# Patient Record
Sex: Female | Born: 1995
Health system: Southern US, Community
[De-identification: ages and names within clinical notes are randomized; demographics above are authoritative.]

---
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
## 2016-11-19 ENCOUNTER — Ambulatory Visit (INDEPENDENT_AMBULATORY_CARE_PROVIDER_SITE_OTHER)

## 2016-11-19 ENCOUNTER — Ambulatory Visit (INDEPENDENT_AMBULATORY_CARE_PROVIDER_SITE_OTHER): Admitting: Podiatry

## 2016-11-19 ENCOUNTER — Ambulatory Visit: Payer: Self-pay

## 2016-11-19 ENCOUNTER — Telehealth: Payer: Self-pay | Admitting: *Deleted

## 2016-11-19 ENCOUNTER — Encounter: Payer: Self-pay | Admitting: Podiatry

## 2016-11-19 DIAGNOSIS — R52 Pain, unspecified: Secondary | ICD-10-CM | POA: Diagnosis not present

## 2016-11-19 DIAGNOSIS — M779 Enthesopathy, unspecified: Secondary | ICD-10-CM

## 2016-11-19 DIAGNOSIS — D3610 Benign neoplasm of peripheral nerves and autonomic nervous system, unspecified: Secondary | ICD-10-CM

## 2016-11-19 MED ORDER — MELOXICAM 7.5 MG PO TABS: 7.5000 mg | ORAL_TABLET | Freq: Every day | ORAL | 0 refills | Status: DC

## 2016-11-19 NOTE — Telephone Encounter (Signed)
Pt's mtr came in to discuss options for pt's foot pain, I told Catherine Miller it would best to come in to be evaluated by Dr. Jacqualyn Posey.11/19/2016-DrJacqualyn Posey ordered MRI with and without contrast of right foot r/o neuroma.

## 2016-11-22 ENCOUNTER — Ambulatory Visit
Admission: RE | Admit: 2016-11-22 | Discharge: 2016-11-22 | Disposition: A | Discharge status: Home / Self Care | Source: Ambulatory Visit | Attending: Podiatry | Admitting: Podiatry

## 2016-11-22 MED ORDER — GADOBENATE DIMEGLUMINE 529 MG/ML IV SOLN
12.0000 mL | Freq: Once | INTRAVENOUS | Status: AC | PRN
  Administered 2016-11-22: 12 mL via INTRAVENOUS

## 2016-11-22 NOTE — Progress Notes (Signed)
Subjective:    Patient ID: Gertie Gowda, female   DOB: 21 y.o.   MRN: [id number]   HPI 21 year old female presents the office today for concerns of right foot pain. She states that she had a sudden injury to her right foot while at home and she put pressure down to her foot. She states that she has some pain that she went to go see another doctor who is diagnosed with a neuroma show steroid injection should help. She points along the second interspace on the right foot which is the majority symptoms but she states the injections the right foot on the third interspace where she pointed with the injection was done. She states this been on the bottom months and the pain has slowly gotten better but does continue. She's concerned of a possible neuroma. She is also been diagnosed with plantar fasciitis and she gets some pain in the heel but overall this is not where the majority of her symptoms are localized. She denies any redness or warmth she does get some swelling to the foot.   Review of Systems  All other systems reviewed and are negative.       Objective:  Physical Exam General: AAO x3, NAD; presents with parents.   Dermatological: Skin is warm, dry and supple bilateral. Nails x 10 are well manicured; remaining integument appears unremarkable at this time. There are no open sores, no preulcerative lesions, no rash or signs of infection present.  Vascular: Dorsalis Pedis artery and Posterior Tibial artery pedal pulses are 2/4 bilateral with immedate capillary fill time. PThere is no pain with calf compression, swelling, warmth, erythema.   Neruologic: Grossly intact via light touch bilateral. Vibratory intact via tuning fork bilateral. Protective threshold with Semmes Wienstein monofilament intact to all pedal sites bilateral.   Musculoskeletal: There is no area pinpoint bony tenderness or pain the vibratory sensation to the bilateral lower extremities. Joint tenderness appears to  be localized to the right second interspace. Is no area of pain to the metatarsals. I am unable to palpate a neuroma in the second or third interspaces. There is trace edema to the second interspace there is no erythema or increase in warmth. This time there is very minimal discomfort to the plantar aspect of the heel on the course or insertion the plantar fascia. No pain lateral compression of the calcaneus. No pain on the Achilles tendon. Thompson test is negative. Muscular strength 5/5 in all groups tested bilateral.  Gait: Unassisted, Nonantalgic.      Assessment:     21 year old female with right foot pain which I believe is from contusion from injury, possible tendinitis/partial tear; rule out neuroma    Plan:   -Treatment options discussed including all alternatives, risks, and complications -Etiology of symptoms were discussed -X-rays were obtained and reviewed with the patient. Unable to appreciate any evidence of acute fracture today. -Given her continued pain on the interspace as well as the swelling I recommended a surgical shoe and case of a partial tear. She is concerned about a neuroma. I ordered an MRI to out neuroma or tendon tear. She is going back to [place] next week and we'll try get this done before she leaves. I recommended continue ice to the area as well as elevation. Hold off on exercising for now. -Meloxicam as needed. -Will discuss after the MRI. Encouraged to call any questions or concerns meantime.  Celesta Gentile, DPM

## 2016-11-23 NOTE — Telephone Encounter (Signed)
-----   Message from Trula Slade, DPM sent at 11/22/2016  6:30 PM EDT ----- Normal MRI- I sent a mychart message but can you please make sure they got it. The message included instructions. Thanks.

## 2016-11-27 NOTE — Telephone Encounter (Addendum)
-----   Message from Trula Slade, DPM sent at 11/22/2016  6:30 PM EDT ----- Normal MRI- I sent a mychart message but can you please make sure they got it. The message included instructions. Thanks.11/27/2016-I spoke with pt's ftr, and he states pt has received the MyChart message, and I told him to have pt call with andy

## 2016-12-16 ENCOUNTER — Other Ambulatory Visit: Payer: Self-pay | Admitting: Podiatry

## 2016-12-17 NOTE — Telephone Encounter (Signed)
Pt needs an appt prior to refills. 

## 2017-01-28 ENCOUNTER — Encounter: Payer: Self-pay | Admitting: Podiatry

## 2017-01-28 ENCOUNTER — Ambulatory Visit (INDEPENDENT_AMBULATORY_CARE_PROVIDER_SITE_OTHER): Admitting: Podiatry

## 2017-01-28 DIAGNOSIS — M779 Enthesopathy, unspecified: Secondary | ICD-10-CM

## 2017-01-29 NOTE — Progress Notes (Signed)
Subjective: Ms. Pethtel presents the office today for follow-up evaluation of right foot pain. She states that she is doing "good". She states that she has some occasional discomfort into her toes overall she is having no pain today she's had no swelling or redness. She says that when she starts to get pain she goes into a sandal and her toes start to grip. She goes back to a regular shoe and the arch supports the pain is resolved. She denies any recent injury or trauma. She has been doing yoga. Denies any systemic complaints such as fevers, chills, nausea, vomiting. No acute changes since last appointment, and no other complaints at this time.   Objective: AAO x3, NAD; presents with mother  DP/PT pulses palpable bilaterally, CRT less than 3 seconds This time there is no area pinpoint bony tenderness or pain the vibratory sensation. I'm unable to elicit any area of tenderness identified to bilateral feet. There is no overlying edema, erythema, increase in warmth. The toes in rectus position. all neuromas identified.  No open lesions or pre-ulcerative lesions.  No pain with calf compression, swelling, warmth, erythema  Assessment: Resolved right foot pain   Plan: -All treatment options discussed with the patient including all alternatives, risks, complications.  -I can discuss the MRI results with the patient. This point she is having no pain. Discussed with her change in shoe gear as well as orthotics. She's using over-the-counter inserts which are helping. If symptoms continue may need to consider custom insert. -Discussed rehabilitation, strengthening exercises for the digits. -Follow-up as needed.  -Patient encouraged to call the office with any questions, concerns, change in symptoms.   Celesta Gentile, DPM

## 2017-02-21 ENCOUNTER — Other Ambulatory Visit: Payer: Self-pay | Admitting: Gastroenterology

## 2017-02-21 ENCOUNTER — Other Ambulatory Visit: Payer: Self-pay | Admitting: Podiatry

## 2017-03-10 ENCOUNTER — Encounter: Payer: Self-pay | Admitting: Podiatry

## 2017-04-10 ENCOUNTER — Ambulatory Visit (HOSPITAL_COMMUNITY): Admit: 2017-04-10 | Admitting: Gastroenterology

## 2017-04-10 ENCOUNTER — Encounter (HOSPITAL_COMMUNITY): Payer: Self-pay

## 2017-04-10 SURGERY — ESOPHAGOGASTRODUODENOSCOPY (EGD) WITH PROPOFOL
Anesthesia: Monitor Anesthesia Care

## 2017-11-29 ENCOUNTER — Telehealth: Payer: Self-pay | Admitting: *Deleted

## 2017-11-29 NOTE — Telephone Encounter (Signed)
Refill request for meloxicam. Pt has not been evaluated in office in over 6 months. Return fax denying.

## 2018-03-25 ENCOUNTER — Ambulatory Visit (HOSPITAL_COMMUNITY)
Admission: EM | Admit: 2018-03-25 | Discharge: 2018-03-25 | Disposition: A | Discharge status: Home / Self Care | Payer: BLUE CROSS/BLUE SHIELD | Attending: Family Medicine | Admitting: Family Medicine

## 2018-03-25 ENCOUNTER — Encounter (HOSPITAL_COMMUNITY): Payer: Self-pay | Admitting: Emergency Medicine

## 2018-03-25 ENCOUNTER — Other Ambulatory Visit: Payer: Self-pay

## 2018-03-25 DIAGNOSIS — R0789 Other chest pain: Secondary | ICD-10-CM

## 2018-03-25 NOTE — ED Provider Notes (Signed)
[place] [facility]   [id number] 03/25/18 Arrival Time: 6295  ASSESSMENT & PLAN:  1. Chest discomfort   Suspect GERD-related. Trial of Zantac 150mg  BID.  Patient history and exam consistent with non-cardiac cause of chest pain. Worsening signs and symptoms discussed and patient verbalized understanding.  Chest pain precautions given. Follow-up Information    Kelton Pillar, MD.   Specialty:  Family Medicine Why:  As needed. Contact information: [address] [place] [address] [phone]        [facility].   Specialty:  Urgent Care Why:  If symptoms worsen. Contact information: [place] [phone]          Reviewed expectations re: course of current medical issues. Questions answered. Outlined signs and symptoms indicating need for more acute intervention. Patient verbalized understanding. After Visit Summary given.   SUBJECTIVE:  Catherine Miller is a 22 y.o. female who presents with complaint of:  Chest Discomfort: Onset gradual, 1 day ago, with stable course since that time. Describes discomfort as intermittent and burning in nature. Discomfort does not radiate. Rates discomfort as a 2/10 in intensity. Associated symptoms: denies dyspnea, fatigue, irregular heart beat, lower extremity edema, palpitations and syncope. Aggravating factors: eating; "maybe". Alleviating factors: none reported. No abdominal pain but occasional bloated feeling. Normal bowel/bladder habits. Is belching more than usual.  Social History   Tobacco Use  Smoking Status Unknown If Ever Smoked  Smokeless Tobacco Never Used    Cardiac risk factors: none. No OCP use.  OTC treatment: Zantac prn; questions mild help but sporadic use.  ROS: As per HPI. All other systems negative.   OBJECTIVE:  Vitals:   03/25/18 1231  BP: 105/66  Pulse: 75  Temp: 98.6 F (37 C)   TempSrc: Oral  SpO2: 100%    General appearance: alert; no distress Eyes: PERRLA; EOMI; conjunctiva normal HENT: normocephalic; atraumatic Neck: supple Lungs: clear to auscultation bilaterally Heart: regular rate and rhythm Chest Wall: non-tender Abdomen: soft, non-tender; bowel sounds normal; no masses or organomegaly; no guarding or rebound tenderness Extremities: no cyanosis or edema; symmetrical with no gross deformities Skin: warm and dry Psychological: alert and cooperative; normal mood and affect   No Known Allergies  PMH: Occasional acid reflux.  Social History   Socioeconomic History  . Marital status: Single    Spouse name: Not on file  . Number of children: Not on file  . Years of education: Not on file  . Highest education level: Not on file  Occupational History  . Not on file  Social Needs  . Financial resource strain: Not on file  . Food insecurity:    Worry: Not on file    Inability: Not on file  . Transportation needs:    Medical: Not on file    Non-medical: Not on file  Tobacco Use  . Smoking status: Unknown If Ever Smoked  . Smokeless tobacco: Never Used  Substance and Sexual Activity  . Alcohol use: Not on file  . Drug use: Not on file  . Sexual activity: Not on file  Lifestyle  . Physical activity:    Days per week: Not on file    Minutes per session: Not on file  . Stress: Not on file  Relationships  . Social connections:    Talks on phone: Not on file    Gets together: Not on file    Attends religious service: Not on  file    Active member of club or organization: Not on file    Attends meetings of clubs or organizations: Not on file    Relationship status: Not on file  . Intimate partner violence:    Fear of current or ex partner: Not on file    Emotionally abused: Not on file    Physically abused: Not on file    Forced sexual activity: Not on file  Other Topics Concern  . Not on file  Social History Narrative  . Not on file     FH: No h/o heart disease reported.  History reviewed. No pertinent surgical history.   Vanessa Kick, MD 04/02/18 [phone]

## 2018-03-25 NOTE — Discharge Instructions (Signed)
You may try over the counter Zantac 150mg  twice daily. This should help if indeed your symptoms are related to acid reflux.

## 2018-03-25 NOTE — ED Triage Notes (Signed)
Pt reports epigastric sharp pain that started yesterday while cooking.  She states she took a reflux medication at 0730 and Tums but it has not helped.  She describes it as intermittent and it feels the most pronounced when she stands up.

## 2018-08-14 IMAGING — MR MR FOOT*R* WO/W CM
4 of 8 series · 21 of 40 positions shown · IV contrast (multihance)
Comparison: Plain films right foot 11/19/2016.

CLINICAL DATA: Pain and cramping in the distal aspect of the right
foot and toes with exercise. Question neuroma.

EXAM:
MRI OF THE RIGHT FOREFOOT WITHOUT AND WITH CONTRAST
TECHNIQUE: Multiplanar, multisequence MR imaging of the right was performed
before and after the administration of intravenous contrast.
CONTRAST:  12 ml MULTIHANCE GADOBENATE DIMEGLUMINE 529 MG/ML IV SOLN

[Series 5: T2 fat-sat · sagittal · 3.0mm · 0.27mm/px · 5 of 24 slices shown (1 of 3)]
[im 1/24]
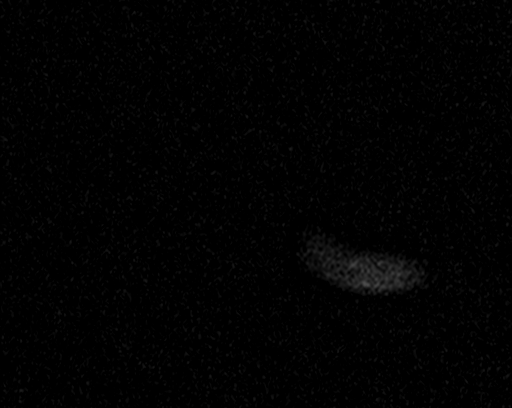
[im 6/24]
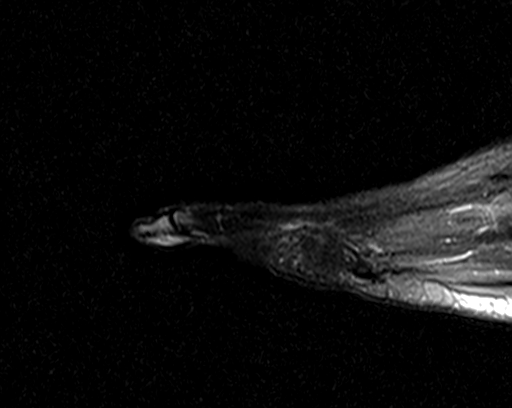
[im 12/24]
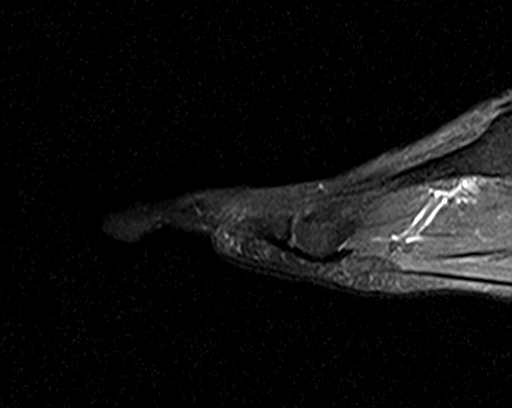
[im 18/24]
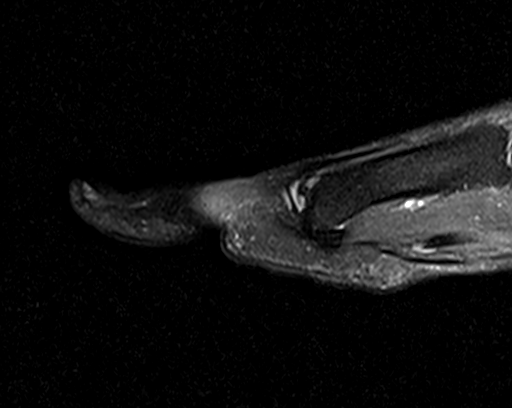
[im 24/24]
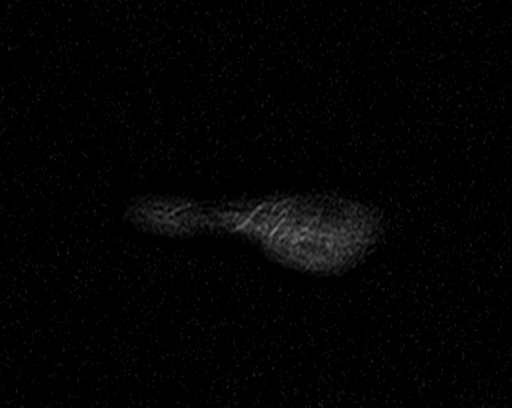

[Series 6: T1 · coronal · 3.0mm · 0.20mm/px · 6 of 36 slices shown]
[im 1/36]
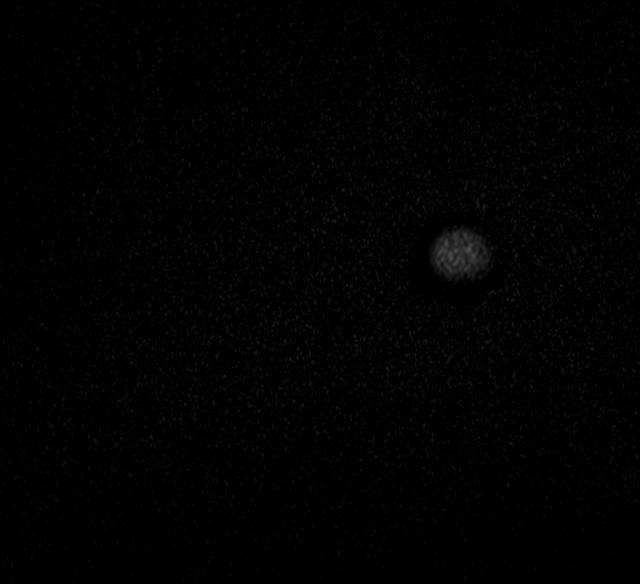
[im 6/36]
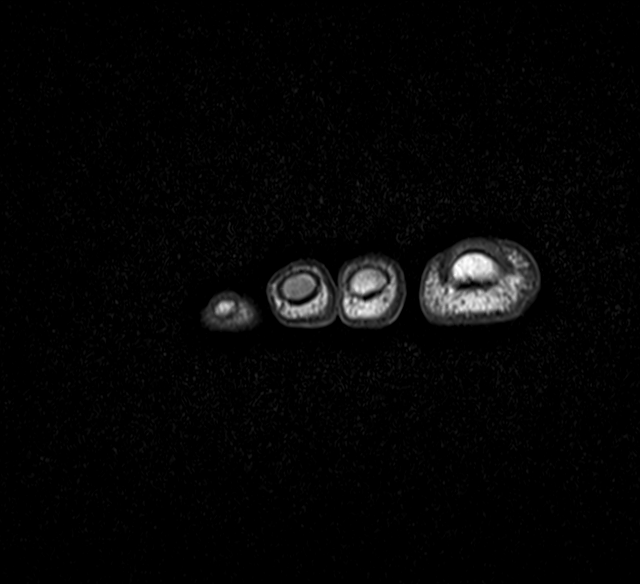
[im 12/36]
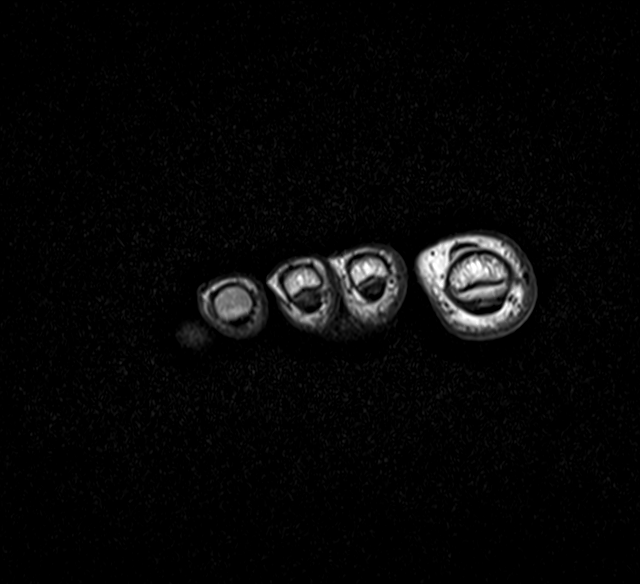
[im 18/36]
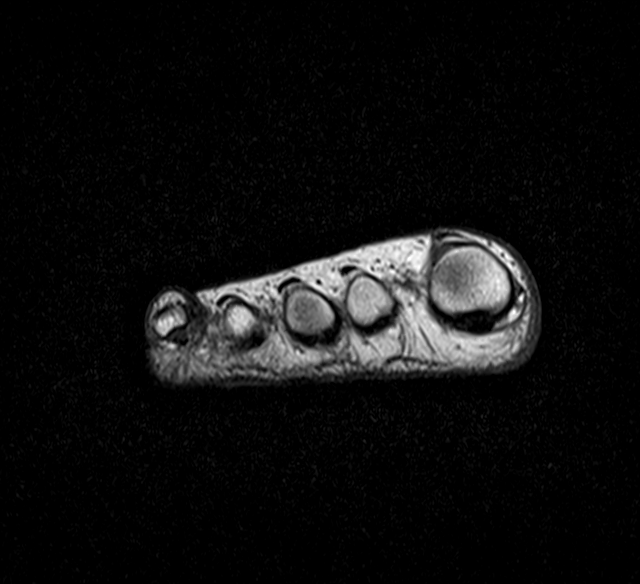
[im 24/36]
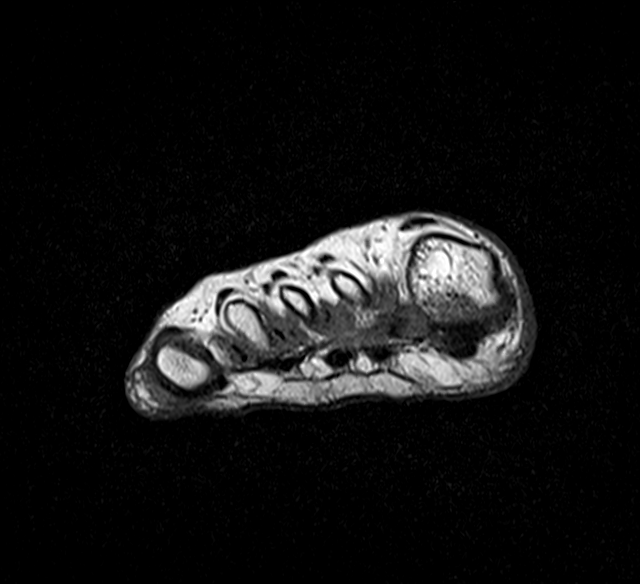
[im 30/36]
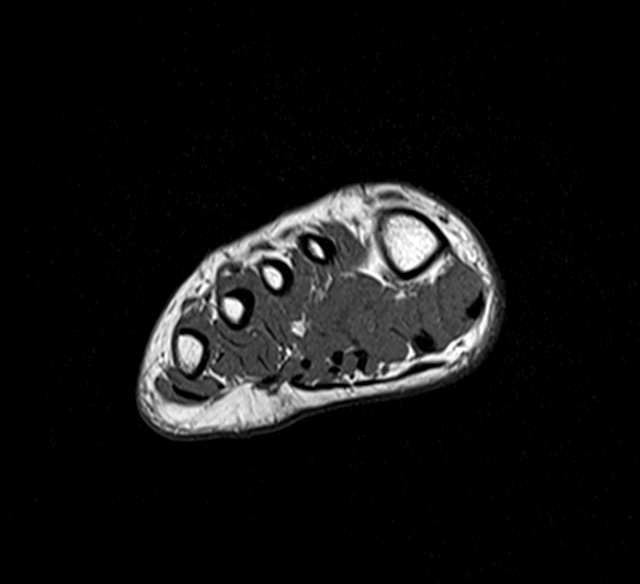

[Series 7: T2 fat-sat · coronal · 3.0mm · 0.27mm/px · 7 of 36 slices shown (2 of 3)]
[im 1/36]
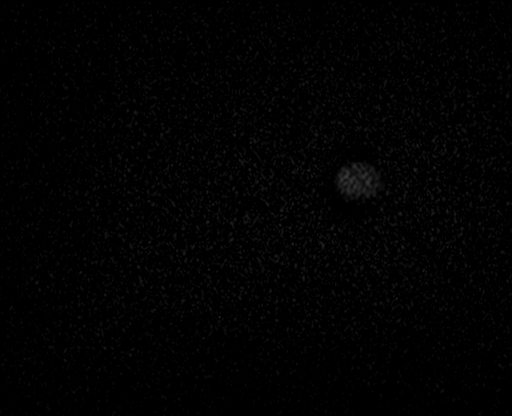
[im 6/36]
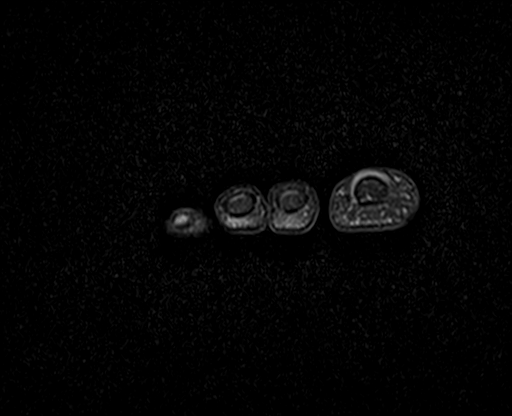
[im 12/36]
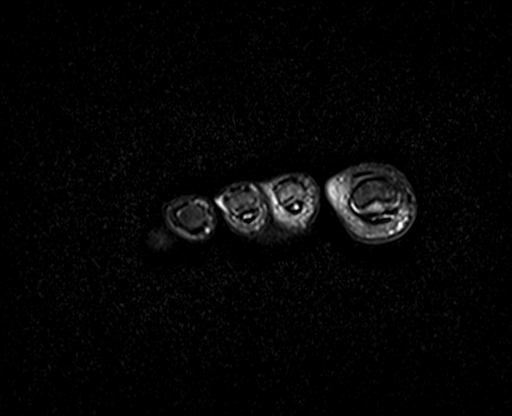
[im 18/36]
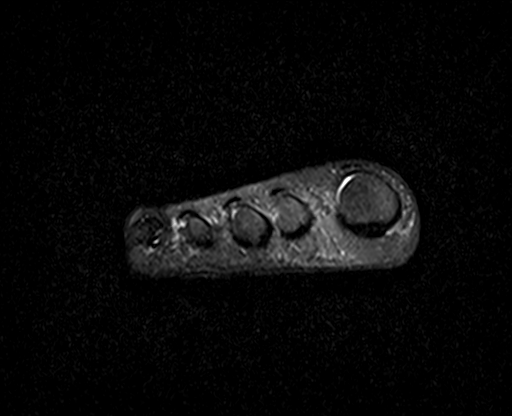
[im 24/36]
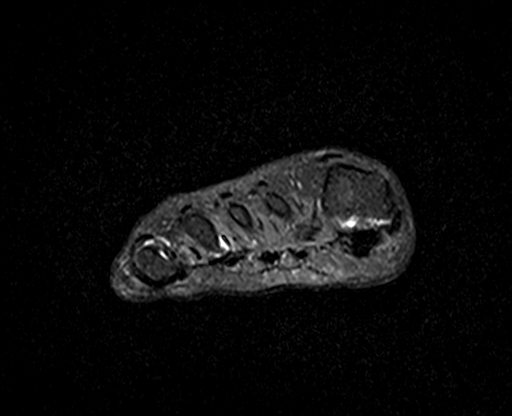
[im 30/36]
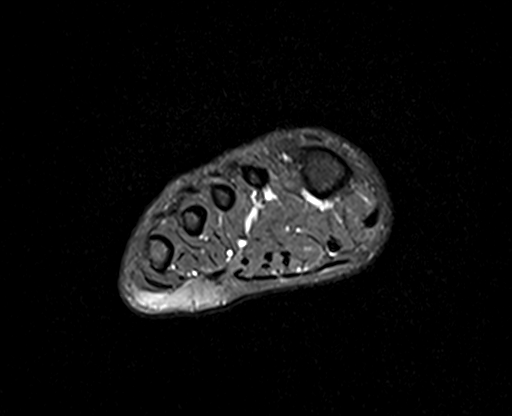
[im 36/36]
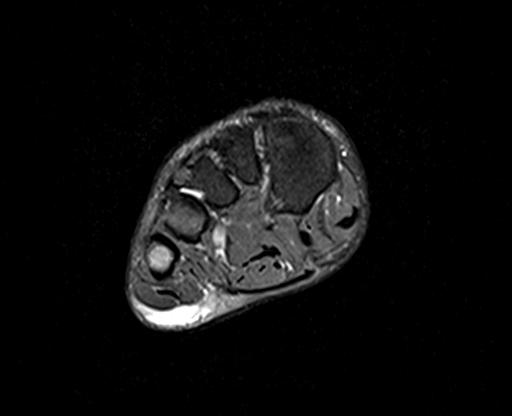

[Series 8: T2 fat-sat · axial · 3.0mm · 0.27mm/px · z∈[-125,-78]mm · 3 of 18 slices shown (3 of 3)]
[im 1/18]
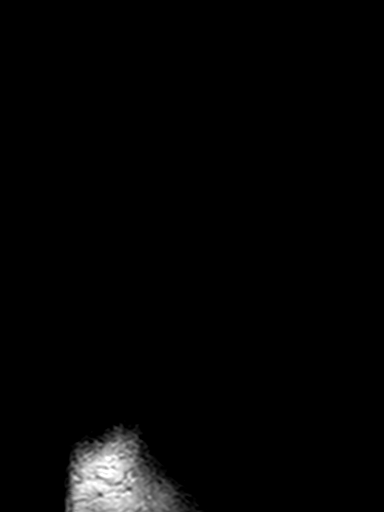
[im 9/18]
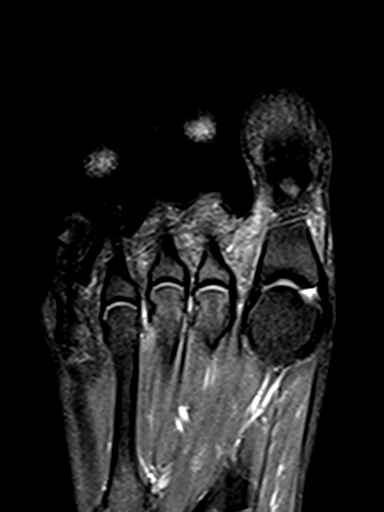
[im 18/18]
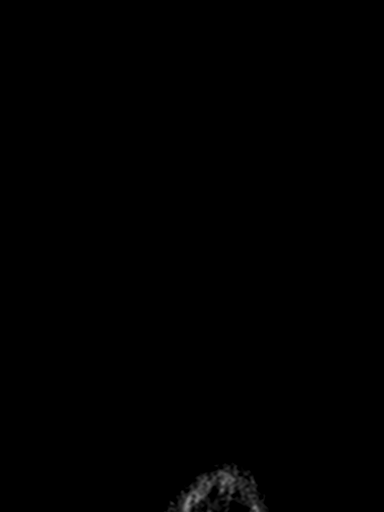

[21 of 40 positions shown; findings below may reference images not displayed]

FINDINGS: Bones/Joint/Cartilage

Marrow signal is normal throughout without fracture, stress change
or worrisome lesion. No evidence of arthropathy is identified.

Ligaments

Intact and normal in appearance.

Muscles and Tendons

Intact and normal in appearance.

Soft tissues

Normal in appearance. No neuroma or other mass is seen. No fluid
collection.
IMPRESSION: Negative for neuroma.  Normal examination.
